# Patient Record
Sex: Female | Born: 1960 | Hispanic: No | Marital: Single | Smoking: Never smoker
Health system: Southern US, Community
[De-identification: ages and names within clinical notes are randomized; demographics above are authoritative.]

## PROBLEM LIST (undated history)

## (undated) DIAGNOSIS — I10 Essential (primary) hypertension: Secondary | ICD-10-CM

## (undated) DIAGNOSIS — Z789 Other specified health status: Secondary | ICD-10-CM

## (undated) HISTORY — DX: Essential (primary) hypertension: I10

## (undated) HISTORY — PX: NO PAST SURGERIES: SHX2092

---

## 2002-07-03 ENCOUNTER — Other Ambulatory Visit: Admission: RE | Admit: 2002-07-03 | Discharge: 2002-07-03 | Payer: Self-pay | Admitting: Family Medicine

## 2010-01-24 ENCOUNTER — Ambulatory Visit (HOSPITAL_COMMUNITY): Admission: RE | Admit: 2010-01-24 | Discharge: 2010-01-24 | Payer: Self-pay | Admitting: Family Medicine

## 2011-04-07 ENCOUNTER — Other Ambulatory Visit (HOSPITAL_COMMUNITY): Payer: Self-pay | Admitting: Family Medicine

## 2011-04-07 DIAGNOSIS — Z1231 Encounter for screening mammogram for malignant neoplasm of breast: Secondary | ICD-10-CM

## 2011-04-20 ENCOUNTER — Ambulatory Visit (HOSPITAL_COMMUNITY)
Admission: RE | Admit: 2011-04-20 | Discharge: 2011-04-20 | Disposition: A | Discharge status: Home / Self Care | Payer: PRIVATE HEALTH INSURANCE | Source: Ambulatory Visit | Attending: Family Medicine | Admitting: Family Medicine

## 2011-04-20 DIAGNOSIS — Z1231 Encounter for screening mammogram for malignant neoplasm of breast: Secondary | ICD-10-CM | POA: Insufficient documentation

## 2012-04-22 ENCOUNTER — Other Ambulatory Visit (HOSPITAL_COMMUNITY): Payer: Self-pay | Admitting: Family Medicine

## 2012-04-22 DIAGNOSIS — Z1231 Encounter for screening mammogram for malignant neoplasm of breast: Secondary | ICD-10-CM

## 2012-05-14 ENCOUNTER — Ambulatory Visit (HOSPITAL_COMMUNITY)
Admission: RE | Admit: 2012-05-14 | Discharge: 2012-05-14 | Disposition: A | Discharge status: Home / Self Care | Payer: PRIVATE HEALTH INSURANCE | Source: Ambulatory Visit | Attending: Family Medicine | Admitting: Family Medicine

## 2012-05-14 DIAGNOSIS — Z1231 Encounter for screening mammogram for malignant neoplasm of breast: Secondary | ICD-10-CM | POA: Insufficient documentation

## 2013-04-16 ENCOUNTER — Other Ambulatory Visit (HOSPITAL_COMMUNITY): Payer: Self-pay | Admitting: Family Medicine

## 2013-04-16 DIAGNOSIS — Z1231 Encounter for screening mammogram for malignant neoplasm of breast: Secondary | ICD-10-CM

## 2013-04-22 ENCOUNTER — Ambulatory Visit (HOSPITAL_COMMUNITY): Admission: RE | Admit: 2013-04-22 | Payer: PRIVATE HEALTH INSURANCE | Source: Ambulatory Visit

## 2013-05-15 ENCOUNTER — Ambulatory Visit (HOSPITAL_COMMUNITY)
Admission: RE | Admit: 2013-05-15 | Discharge: 2013-05-15 | Disposition: A | Discharge status: Home / Self Care | Payer: 59 | Source: Ambulatory Visit | Attending: Family Medicine | Admitting: Family Medicine

## 2013-05-15 DIAGNOSIS — Z1231 Encounter for screening mammogram for malignant neoplasm of breast: Secondary | ICD-10-CM

## 2013-07-08 IMAGING — CR DG FINGER MIDDLE 2+V*R*
1 series · 1 of 1 positions shown · non-contrast
Comparison: None.

CLINICAL DATA: Third finger injury at work.Trauma.

RIGHT MIDDLE FINGER 2+V

[PA]
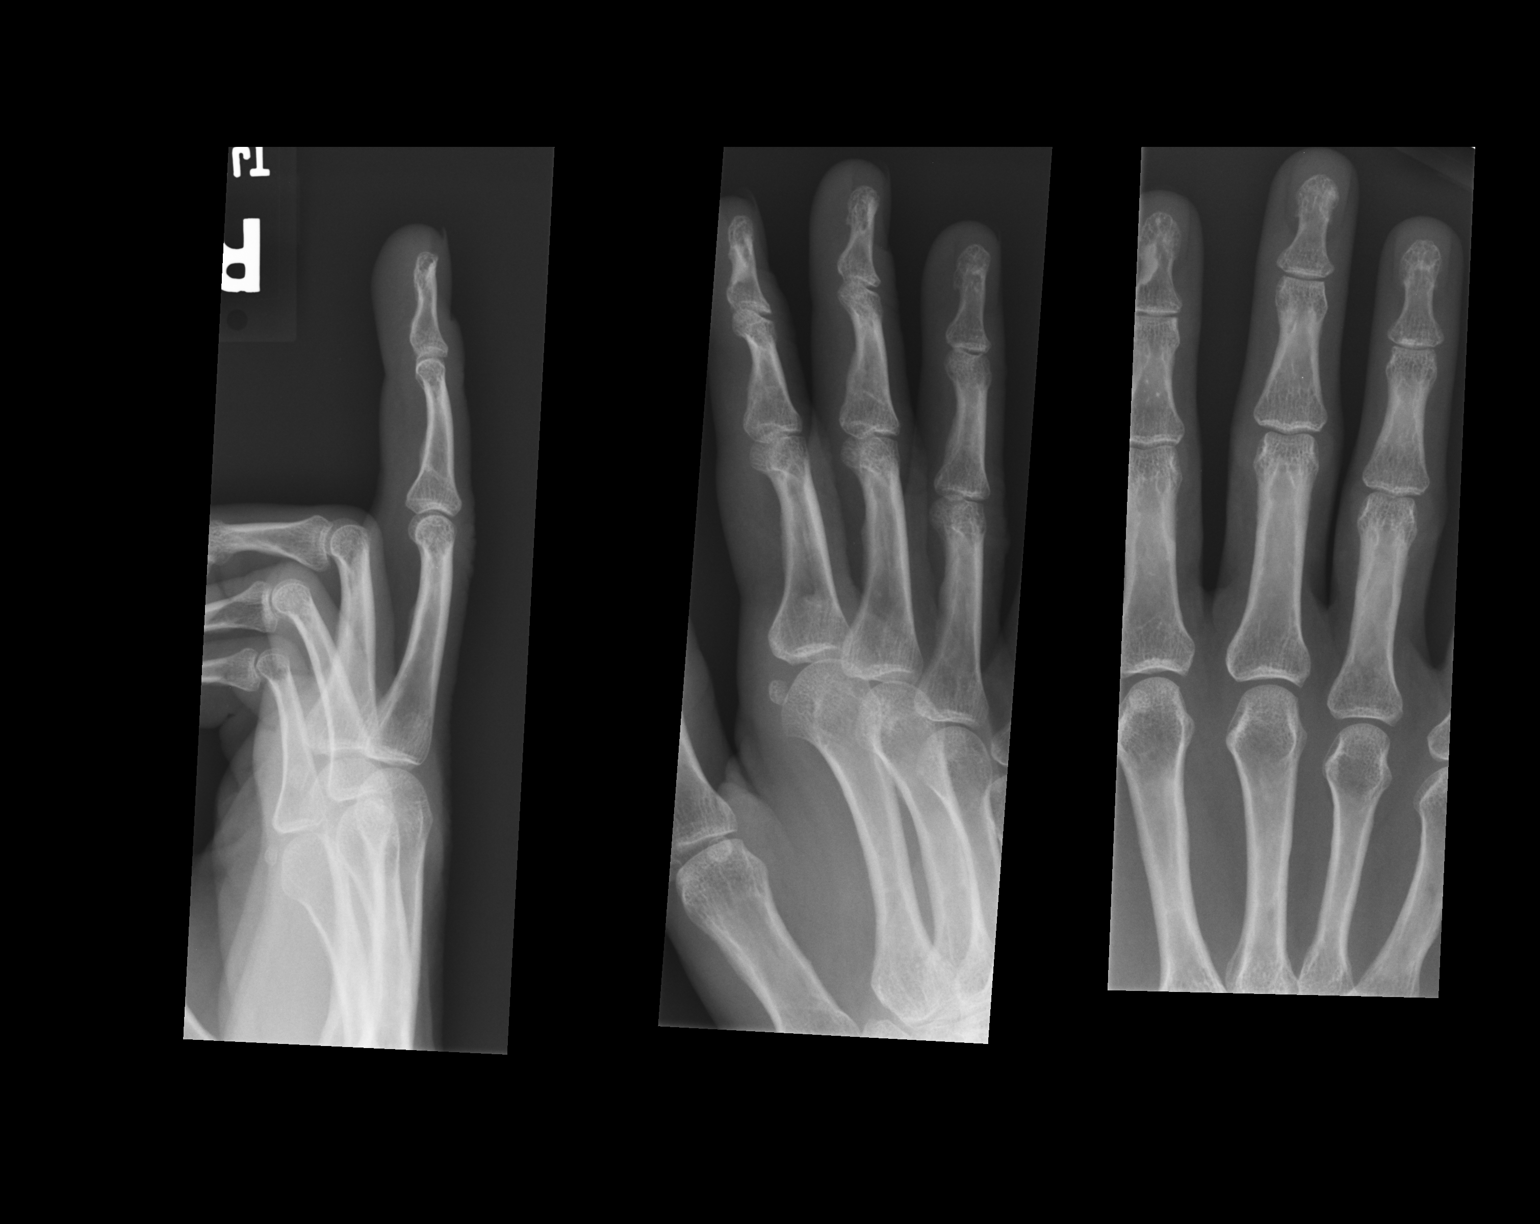

[1 of 1 positions shown; findings below may reference images not displayed]

FINDINGS: There is soft tissue swelling of the terminal phalanx of
the right long finger.  There is no fracture identified.  No
radiopaque foreign body.
IMPRESSION: Soft tissue swelling over the terminal phalanx of the right long
finger.

## 2013-10-28 ENCOUNTER — Ambulatory Visit (INDEPENDENT_AMBULATORY_CARE_PROVIDER_SITE_OTHER): Payer: 59 | Admitting: Emergency Medicine

## 2013-10-28 VITALS — BP 140/90 | HR 73 | Temp 98.1°F | Resp 16 | Ht 65.25 in | Wt 159.8 lb

## 2013-10-28 DIAGNOSIS — M549 Dorsalgia, unspecified: Secondary | ICD-10-CM

## 2013-10-28 NOTE — Patient Instructions (Signed)
Back Pain, Adult Low back pain is very common. About 1 in 5 people have back pain.The cause of low back pain is rarely dangerous. The pain often gets better over time.About half of people with a sudden onset of back pain feel better in just 2 weeks. About 8 in 10 people feel better by 6 weeks.  CAUSES Some common causes of back pain include:  Strain of the muscles or ligaments supporting the spine.  Wear and tear (degeneration) of the spinal discs.  Arthritis.  Direct injury to the back. DIAGNOSIS Most of the time, the direct cause of low back pain is not known.However, back pain can be treated effectively even when the exact cause of the pain is unknown.Answering your caregiver's questions about your overall health and symptoms is one of the most accurate ways to make sure the cause of your pain is not dangerous. If your caregiver needs more information, he or she may order lab work or imaging tests (X-rays or MRIs).However, even if imaging tests show changes in your back, this usually does not require surgery. HOME CARE INSTRUCTIONS For many people, back pain returns.Since low back pain is rarely dangerous, it is often a condition that people can learn to manageon their own.   Remain active. It is stressful on the back to sit or stand in one place. Do not sit, drive, or stand in one place for more than 30 minutes at a time. Take short walks on level surfaces as soon as pain allows.Try to increase the length of time you walk each day.  Do not stay in bed.Resting more than 1 or 2 days can delay your recovery.  Do not avoid exercise or work.Your body is made to move.It is not dangerous to be active, even though your back may hurt.Your back will likely heal faster if you return to being active before your pain is gone.  Pay attention to your body when you bend and lift. Many people have less discomfortwhen lifting if they bend their knees, keep the load close to their bodies,and  avoid twisting. Often, the most comfortable positions are those that put less stress on your recovering back.  Find a comfortable position to sleep. Use a firm mattress and lie on your side with your knees slightly bent. If you lie on your back, put a pillow under your knees.  Only take over-the-counter or prescription medicines as directed by your caregiver. Over-the-counter medicines to reduce pain and inflammation are often the most helpful.Your caregiver may prescribe muscle relaxant drugs.These medicines help dull your pain so you can more quickly return to your normal activities and healthy exercise.  Put ice on the injured area.  Put ice in a plastic bag.  Place a towel between your skin and the bag.  Leave the ice on for 15-20 minutes, 03-04 times a day for the first 2 to 3 days. After that, ice and heat may be alternated to reduce pain and spasms.  Ask your caregiver about trying back exercises and gentle massage. This may be of some benefit.  Avoid feeling anxious or stressed.Stress increases muscle tension and can worsen back pain.It is important to recognize when you are anxious or stressed and learn ways to manage it.Exercise is a great option. SEEK MEDICAL CARE IF:  You have pain that is not relieved with rest or medicine.  You have pain that does not improve in 1 week.  You have new symptoms.  You are generally not feeling well. SEEK   IMMEDIATE MEDICAL CARE IF:   You have pain that radiates from your back into your legs.  You develop new bowel or bladder control problems.  You have unusual weakness or numbness in your arms or legs.  You develop nausea or vomiting.  You develop abdominal pain.  You feel faint. Document Released: 10/23/2005 Document Revised: 04/23/2012 Document Reviewed: 03/13/2011 Exit[employer] Patient Information 2014 [employer].  

## 2013-10-28 NOTE — Progress Notes (Signed)
[facility] [address], [place] [address] [phone]  Date:  10/28/2013   Name:  Madison Turner   DOB:  Feb 19, 1961   MRN:  [id number]  PCP:  No primary provider on file.    Chief Complaint: Doctor Note   History of Present Illness:  Chelby Salata is a 52 y.o. very pleasant female patient who presents with the following:  Claims an injury to her low back 6 years ago.  Was transferred to another unit where the beds are crank rather than electric.  She has been on the new position for one week and is forced to sit on the floor to crank the beds.  Otherwise she develops low back pain.  No improvement with over the counter medications or other home remedies. Denies other complaint or health concern today.   There are no active problems to display for this patient.   History reviewed. No pertinent past medical history.  History reviewed. No pertinent past surgical history.  History  Substance Use Topics  . Smoking status: Never Smoker   . Smokeless tobacco: Not on file  . Alcohol Use: No    History reviewed. No pertinent family history.  No Known Allergies  Medication list has been reviewed and updated.  No current outpatient prescriptions on file prior to visit.   No current facility-administered medications on file prior to visit.    Review of Systems:  As per HPI, otherwise negative.    Physical Examination: Filed Vitals:   10/28/13 1331  BP: 140/90  Pulse: 73  Temp: 98.1 F (36.7 C)  Resp: 16   Filed Vitals:   10/28/13 1331  Height: 5' 5.25" (1.657 m)  Weight: 159 lb 12.8 oz (72.485 kg)   Body mass index is 26.4 kg/(m^2). Ideal Body Weight: Weight in (lb) to have BMI = 25: 151.1   GEN: WDWN, NAD, Non-toxic, Alert & Oriented x 3 HEENT: Atraumatic, Normocephalic.  Ears and Nose: No external deformity. EXTR: No clubbing/cyanosis/edema NEURO: Normal gait.  PSYCH: Normally interactive. Conversant. Not depressed or anxious  appearing.  Calm demeanor.  Back  Not tender.  Assessment and Plan: Low back pain Work note  Signed,  Phillips Odor, MD

## 2013-10-29 ENCOUNTER — Telehealth: Payer: Self-pay

## 2013-10-29 NOTE — Telephone Encounter (Signed)
Patient needs a new note for work. Information is in Dr. Ewell Poe box.

## 2013-10-31 NOTE — Telephone Encounter (Signed)
What is she requesting? 

## 2013-11-03 NOTE — Telephone Encounter (Signed)
What was it you put in the box? It is not there now.

## 2013-11-03 NOTE — Telephone Encounter (Signed)
I don't know what information she needs. I put everything in Dr. Ewell Poe box.

## 2013-11-03 NOTE — Telephone Encounter (Signed)
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

## 2013-11-03 NOTE — Telephone Encounter (Signed)
There is nothing in the box, what does she need? Do you have what she dropped off?

## 2013-11-04 ENCOUNTER — Telehealth: Payer: Self-pay | Admitting: Radiology

## 2013-11-04 NOTE — Telephone Encounter (Signed)
New note provided based on job description provided by patient

## 2014-04-27 ENCOUNTER — Other Ambulatory Visit (HOSPITAL_COMMUNITY): Payer: Self-pay | Admitting: Family Medicine

## 2014-04-27 DIAGNOSIS — Z1231 Encounter for screening mammogram for malignant neoplasm of breast: Secondary | ICD-10-CM

## 2014-05-19 ENCOUNTER — Ambulatory Visit (HOSPITAL_COMMUNITY)
Admission: RE | Admit: 2014-05-19 | Discharge: 2014-05-19 | Disposition: A | Discharge status: Home / Self Care | Payer: 59 | Source: Ambulatory Visit | Attending: Family Medicine | Admitting: Family Medicine

## 2014-05-19 DIAGNOSIS — Z1231 Encounter for screening mammogram for malignant neoplasm of breast: Secondary | ICD-10-CM

## 2015-01-05 ENCOUNTER — Other Ambulatory Visit: Payer: Self-pay | Admitting: Orthopedic Surgery

## 2015-01-11 ENCOUNTER — Encounter (HOSPITAL_BASED_OUTPATIENT_CLINIC_OR_DEPARTMENT_OTHER): Payer: Self-pay | Admitting: *Deleted

## 2015-01-11 NOTE — Progress Notes (Signed)
No labs needed

## 2015-01-13 ENCOUNTER — Ambulatory Visit (HOSPITAL_BASED_OUTPATIENT_CLINIC_OR_DEPARTMENT_OTHER): Payer: Managed Care, Other (non HMO) | Admitting: Anesthesiology

## 2015-01-13 ENCOUNTER — Ambulatory Visit (HOSPITAL_BASED_OUTPATIENT_CLINIC_OR_DEPARTMENT_OTHER)
Admission: RE | Admit: 2015-01-13 | Discharge: 2015-01-13 | Disposition: A | Discharge status: Home / Self Care | Payer: Managed Care, Other (non HMO) | Source: Ambulatory Visit | Attending: Orthopedic Surgery | Admitting: Orthopedic Surgery

## 2015-01-13 ENCOUNTER — Encounter (HOSPITAL_BASED_OUTPATIENT_CLINIC_OR_DEPARTMENT_OTHER)
Admission: RE | Disposition: A | Discharge status: Home / Self Care | Payer: Self-pay | Source: Ambulatory Visit | Attending: Orthopedic Surgery

## 2015-01-13 ENCOUNTER — Encounter (HOSPITAL_BASED_OUTPATIENT_CLINIC_OR_DEPARTMENT_OTHER): Payer: Self-pay | Admitting: Anesthesiology

## 2015-01-13 DIAGNOSIS — M65311 Trigger thumb, right thumb: Secondary | ICD-10-CM | POA: Diagnosis present

## 2015-01-13 HISTORY — DX: Other specified health status: Z78.9

## 2015-01-13 HISTORY — PX: TRIGGER FINGER RELEASE: SHX641

## 2015-01-13 LAB — POCT HEMOGLOBIN-HEMACUE: Hemoglobin: 11.9 g/dL — ABNORMAL LOW (ref 12.0–15.0)

## 2015-01-13 SURGERY — RELEASE, A1 PULLEY, FOR TRIGGER FINGER
Anesthesia: Monitor Anesthesia Care | Site: Hand | Laterality: Right

## 2015-01-13 MED ORDER — FENTANYL CITRATE 0.05 MG/ML IJ SOLN
INTRAMUSCULAR | Status: AC
  Filled 2015-01-13: qty 6

## 2015-01-13 MED ORDER — ONDANSETRON HCL 4 MG/2ML IJ SOLN: 4.0000 mg | Freq: Once | INTRAMUSCULAR | Status: DC | PRN

## 2015-01-13 MED ORDER — MIDAZOLAM HCL 2 MG/2ML IJ SOLN
INTRAMUSCULAR | Status: AC
  Filled 2015-01-13: qty 2

## 2015-01-13 MED ORDER — ONDANSETRON HCL 4 MG/2ML IJ SOLN
INTRAMUSCULAR | Status: DC | PRN
  Administered 2015-01-13: 4 mg via INTRAVENOUS

## 2015-01-13 MED ORDER — OXYCODONE HCL 5 MG PO TABS: 5.0000 mg | ORAL_TABLET | Freq: Once | ORAL | Status: DC | PRN

## 2015-01-13 MED ORDER — LIDOCAINE HCL (CARDIAC) 20 MG/ML IV SOLN
INTRAVENOUS | Status: DC | PRN
  Administered 2015-01-13: 35 mg via INTRAVENOUS

## 2015-01-13 MED ORDER — HYDROMORPHONE HCL 1 MG/ML IJ SOLN: 0.2500 mg | INTRAMUSCULAR | Status: DC | PRN

## 2015-01-13 MED ORDER — FENTANYL CITRATE 0.05 MG/ML IJ SOLN: 50.0000 ug | INTRAMUSCULAR | Status: DC | PRN

## 2015-01-13 MED ORDER — HYDROCODONE-ACETAMINOPHEN 5-325 MG PO TABS: 1.0000 | ORAL_TABLET | Freq: Four times a day (QID) | ORAL | Status: DC | PRN

## 2015-01-13 MED ORDER — PROPOFOL 10 MG/ML IV BOLUS
INTRAVENOUS | Status: AC
  Filled 2015-01-13: qty 80

## 2015-01-13 MED ORDER — LACTATED RINGERS IV SOLN
INTRAVENOUS | Status: DC
  Administered 2015-01-13: 11:00:00 via INTRAVENOUS

## 2015-01-13 MED ORDER — PROPOFOL INFUSION 10 MG/ML OPTIME
INTRAVENOUS | Status: DC | PRN
Start: 2015-01-13 — End: 2015-01-13
  Administered 2015-01-13: 25 ug/kg/min via INTRAVENOUS

## 2015-01-13 MED ORDER — PROPOFOL 10 MG/ML IV BOLUS: INTRAVENOUS | Status: DC | PRN

## 2015-01-13 MED ORDER — BUPIVACAINE HCL (PF) 0.5 % IJ SOLN
INTRAMUSCULAR | Status: DC | PRN
  Administered 2015-01-13: 7 mL

## 2015-01-13 MED ORDER — FENTANYL CITRATE 0.05 MG/ML IJ SOLN
INTRAMUSCULAR | Status: DC | PRN
  Administered 2015-01-13 (×2): 50 ug via INTRAVENOUS

## 2015-01-13 MED ORDER — CEFAZOLIN SODIUM 1-5 GM-% IV SOLN
INTRAVENOUS | Status: AC
  Filled 2015-01-13: qty 50

## 2015-01-13 MED ORDER — MIDAZOLAM HCL 2 MG/2ML IJ SOLN
1.0000 mg | INTRAMUSCULAR | Status: DC | PRN
Start: 2015-01-13 — End: 2015-01-13

## 2015-01-13 MED ORDER — CHLORHEXIDINE GLUCONATE 4 % EX LIQD: 60.0000 mL | Freq: Once | CUTANEOUS | Status: DC

## 2015-01-13 MED ORDER — PROPOFOL 10 MG/ML IV EMUL
INTRAVENOUS | Status: AC
  Filled 2015-01-13: qty 50

## 2015-01-13 MED ORDER — MIDAZOLAM HCL 5 MG/5ML IJ SOLN
INTRAMUSCULAR | Status: DC | PRN
  Administered 2015-01-13: 2 mg via INTRAVENOUS

## 2015-01-13 MED ORDER — OXYCODONE HCL 5 MG/5ML PO SOLN: 5.0000 mg | Freq: Once | ORAL | Status: DC | PRN

## 2015-01-13 MED ORDER — CEFAZOLIN SODIUM-DEXTROSE 2-3 GM-% IV SOLR: 2.0000 g | INTRAVENOUS | Status: DC

## 2015-01-13 MED ORDER — CEFAZOLIN SODIUM-DEXTROSE 2-3 GM-% IV SOLR
INTRAVENOUS | Status: DC | PRN
  Administered 2015-01-13: 2 g via INTRAVENOUS

## 2015-01-13 SURGICAL SUPPLY — 48 items
BANDAGE ELASTIC 3 VELCRO ST LF (GAUZE/BANDAGES/DRESSINGS) ×2 IMPLANT
BLADE MINI RND TIP GREEN BEAV (BLADE) IMPLANT
BLADE SURG 15 STRL LF DISP TIS (BLADE) ×1 IMPLANT
BLADE SURG 15 STRL SS (BLADE) ×3
BNDG CMPR 9X4 STRL LF SNTH (GAUZE/BANDAGES/DRESSINGS)
BNDG CMPR MD 5X2 ELC HKLP STRL (GAUZE/BANDAGES/DRESSINGS) ×1
BNDG ELASTIC 2 VLCR STRL LF (GAUZE/BANDAGES/DRESSINGS) ×3 IMPLANT
BNDG ESMARK 4X9 LF (GAUZE/BANDAGES/DRESSINGS) IMPLANT
CORDS BIPOLAR (ELECTRODE) IMPLANT
COVER BACK TABLE 60X90IN (DRAPES) ×3 IMPLANT
COVER MAYO STAND STRL (DRAPES) ×3 IMPLANT
CUFF TOURNIQUET SINGLE 18IN (TOURNIQUET CUFF) IMPLANT
DECANTER SPIKE VIAL GLASS SM (MISCELLANEOUS) IMPLANT
DRAPE EXTREMITY T 121X128X90 (DRAPE) ×3 IMPLANT
DRSG EMULSION OIL 3X3 NADH (GAUZE/BANDAGES/DRESSINGS) IMPLANT
DURAPREP 26ML APPLICATOR (WOUND CARE) ×3 IMPLANT
ELECT NDL TIP 2.8 STRL (NEEDLE) IMPLANT
ELECT NEEDLE TIP 2.8 STRL (NEEDLE) IMPLANT
ELECT REM PT RETURN 9FT ADLT (ELECTROSURGICAL) IMPLANT
ELECTRODE REM PT RTRN 9FT ADLT (ELECTROSURGICAL) IMPLANT
GAUZE SPONGE 4X4 12PLY STRL (GAUZE/BANDAGES/DRESSINGS) ×3 IMPLANT
GLOVE BIOGEL PI IND STRL 8 (GLOVE) ×2 IMPLANT
GLOVE BIOGEL PI INDICATOR 8 (GLOVE) ×4
GLOVE ECLIPSE 7.5 STRL STRAW (GLOVE) ×6 IMPLANT
GOWN STRL REUS W/ TWL LRG LVL3 (GOWN DISPOSABLE) ×1 IMPLANT
GOWN STRL REUS W/ TWL XL LVL3 (GOWN DISPOSABLE) ×1 IMPLANT
GOWN STRL REUS W/TWL LRG LVL3 (GOWN DISPOSABLE) ×3
GOWN STRL REUS W/TWL XL LVL3 (GOWN DISPOSABLE) ×6 IMPLANT
NDL HYPO 25X1 1.5 SAFETY (NEEDLE) IMPLANT
NEEDLE HYPO 25X1 1.5 SAFETY (NEEDLE) ×3 IMPLANT
NS IRRIG 1000ML POUR BTL (IV SOLUTION) ×3 IMPLANT
PACK BASIN DAY SURGERY FS (CUSTOM PROCEDURE TRAY) ×3 IMPLANT
PADDING CAST ABS 3INX4YD NS (CAST SUPPLIES) ×2
PADDING CAST ABS 4INX4YD NS (CAST SUPPLIES) ×2
PADDING CAST ABS COTTON 3X4 (CAST SUPPLIES) IMPLANT
PADDING CAST ABS COTTON 4X4 ST (CAST SUPPLIES) ×1 IMPLANT
PADDING UNDERCAST 2 STRL (CAST SUPPLIES) ×2
PADDING UNDERCAST 2X4 STRL (CAST SUPPLIES) ×1 IMPLANT
PENCIL BUTTON HOLSTER BLD 10FT (ELECTRODE) IMPLANT
SPLINT PLASTER CAST XFAST 3X15 (CAST SUPPLIES) IMPLANT
SPLINT PLASTER XTRA FASTSET 3X (CAST SUPPLIES)
STOCKINETTE 4X48 STRL (DRAPES) ×3 IMPLANT
SUT ETHILON 4 0 P 3 18 (SUTURE) ×3 IMPLANT
SYR BULB 3OZ (MISCELLANEOUS) ×3 IMPLANT
SYR CONTROL 10ML LL (SYRINGE) IMPLANT
TOWEL OR 17X24 6PK STRL BLUE (TOWEL DISPOSABLE) ×6 IMPLANT
TOWEL OR NON WOVEN STRL DISP B (DISPOSABLE) ×3 IMPLANT
UNDERPAD 30X30 INCONTINENT (UNDERPADS AND DIAPERS) ×3 IMPLANT

## 2015-01-13 NOTE — H&P (Signed)
PREOPERATIVE H&P  Chief Complaint: locking r thumb  HPI: Madison Turner is a 54 y.o. female who presents for evaluation of locking r thumb. It has been present for 3 months and has been worsening. She has failed conservative measures. Pain is rated as moderate.  Past Medical History  Diagnosis Date  . Medical history non-contributory    Past Surgical History  Procedure Laterality Date  . No past surgeries     History   Social History  . Marital Status: Single    Spouse Name: N/A  . Number of Children: N/A  . Years of Education: N/A   Social History Main Topics  . Smoking status: Never Smoker   . Smokeless tobacco: Not on file  . Alcohol Use: No  . Drug Use: No  . Sexual Activity: Not on file   Other Topics Concern  . None   Social History Narrative   History reviewed. No pertinent family history. No Known Allergies Prior to Admission medications   Not on File     Positive ROS: none  All other systems have been reviewed and were otherwise negative with the exception of those mentioned in the HPI and as above.  Physical Exam: Filed Vitals:   01/13/15 1025  BP: 134/80  Pulse: 79  Temp: 98.4 F (36.9 C)  Resp: 20    General: Alert, no acute distress Cardiovascular: No pedal edema Respiratory: No cyanosis, no use of accessory musculature GI: No organomegaly, abdomen is soft and non-tender Skin: No lesions in the area of chief complaint Neurologic: Sensation intact distally Psychiatric: Patient is competent for consent with normal mood and affect Lymphatic: No axillary or cervical lymphadenopathy  MUSCULOSKELETAL: r thumb: triggering at a-1 pulley  Assessment/Plan: right trigger thumb Plan for Procedure(s): RELEASE TRIGGER FINGER/A-1 PULLEY  The risks benefits and alternatives were discussed with the patient including but not limited to the risks of nonoperative treatment, versus surgical intervention including infection, bleeding, nerve injury,  malunion, nonunion, hardware prominence, hardware failure, need for hardware removal, blood clots, cardiopulmonary complications, morbidity, mortality, among others, and they were willing to proceed.  Predicted outcome is good, although there will be at least a six to nine month expected recovery.  Aryannah Mohon L, MD 01/13/2015 10:34 AM

## 2015-01-13 NOTE — Discharge Instructions (Signed)
Elevate your right hand as much as possible. Move your thumb as tolerated. In 3 days remove the dressing and applying a Band-Aid over the small incision. Re-wrap the Ace bandage around your thumb and wrist. Keep your incision dry until your follow-up appointment. If needed you may use a waterproof Band-Aid over the incision when you shower.   Post Anesthesia Home Care Instructions  Activity: Get plenty of rest for the remainder of the day. A responsible adult should stay with you for 24 hours following the procedure.  For the next 24 hours, DO NOT: -Drive a car -Advertising copywriterperate machinery -Drink alcoholic beverages -Take any medication unless instructed by your physician -Make any legal decisions or sign important papers.  Meals: Start with liquid foods such as gelatin or soup. Progress to regular foods as tolerated. Avoid greasy, spicy, heavy foods. If nausea and/or vomiting occur, drink only clear liquids until the nausea and/or vomiting subsides. Call your physician if vomiting continues.  Special Instructions/Symptoms: Your throat may feel dry or sore from the anesthesia or the breathing tube placed in your throat during surgery. If this causes discomfort, gargle with warm salt water. The discomfort should disappear within 24 hours.

## 2015-01-13 NOTE — Transfer of Care (Signed)
Immediate Anesthesia Transfer of Care Note  Patient: Madison Turner  Procedure(s) Performed: Procedure(s): RELEASE TRIGGER FINGER/A-1 PULLEY (Right)  Patient Location: PACU  Anesthesia Type:MAC and Bier block  Level of Consciousness: awake and patient cooperative  Airway & Oxygen Therapy: Patient Spontanous Breathing and Patient connected to face mask oxygen  Post-op Assessment: Report given to RN and Post -op Vital signs reviewed and stable  Post vital signs: Reviewed and stable  Last Vitals:  Filed Vitals:   01/13/15 1136  BP:   Pulse: 62  Temp:   Resp:     Complications: No apparent anesthesia complications

## 2015-01-13 NOTE — Anesthesia Procedure Notes (Signed)
Anesthesia Regional Block:  Bier block (IV Regional)  Pre-Anesthetic Checklist: ,, timeout performed, Correct Patient, Correct Site, Correct Laterality, Correct Procedure, Correct Position, site marked, Risks and benefits discussed,  Surgical consent,  Pre-op evaluation,  At surgeon's request and post-op pain management  Laterality: Right     Bier block (IV Regional) Narrative:   Additional Notes: Right hand Bier block placed at 1102 without complication.  0.5% Lidocaine 35 ml infused slowly without difficulty or patient complaint of any new or different sensations.  Craige CottaM Kenyata Guess CRNA

## 2015-01-13 NOTE — Anesthesia Preprocedure Evaluation (Signed)
Anesthesia Evaluation  Patient identified by MRN, date of birth, ID band Patient awake    Reviewed: Allergy & Precautions, NPO status , Patient's Chart, lab work & pertinent test results  Airway Mallampati: I  TM Distance: >3 FB Neck ROM: Full    Dental  (+) Teeth Intact, Dental Advisory Given   Pulmonary  breath sounds clear to auscultation        Cardiovascular Rhythm:Regular Rate:Normal     Neuro/Psych    GI/Hepatic   Endo/Other    Renal/GU      Musculoskeletal   Abdominal   Peds  Hematology   Anesthesia Other Findings   Reproductive/Obstetrics                             Anesthesia Physical Anesthesia Plan  ASA: I  Anesthesia Plan: MAC and Bier Block   Post-op Pain Management:    Induction: Intravenous  Airway Management Planned:   Additional Equipment:   Intra-op Plan:   Post-operative Plan: Extubation in OR  Informed Consent: I have reviewed the patients History and Physical, chart, labs and discussed the procedure including the risks, benefits and alternatives for the proposed anesthesia with the patient or authorized representative who has indicated his/her understanding and acceptance.   Dental advisory given  Plan Discussed with: CRNA, Anesthesiologist and Surgeon  Anesthesia Plan Comments:         Anesthesia Quick Evaluation

## 2015-01-13 NOTE — Anesthesia Postprocedure Evaluation (Signed)
  Anesthesia Post-op Note  Patient: Madison Turner  Procedure(s) Performed: Procedure(s): RELEASE TRIGGER FINGER/A-1 PULLEY (Right)  Patient Location: PACU  Anesthesia Type: MAC, Bier Block   Level of Consciousness: awake, alert  and oriented  Airway and Oxygen Therapy: Patient Spontanous Breathing  Post-op Pain: none  Post-op Assessment: Post-op Vital signs reviewed  Post-op Vital Signs: Reviewed  Last Vitals:  Filed Vitals:   01/13/15 1215  BP: 138/75  Pulse: 60  Temp: 36.4 C  Resp: 16    Complications: No apparent anesthesia complications

## 2015-01-13 NOTE — Brief Op Note (Signed)
01/13/2015  11:46 AM  PATIENT:  Madison Turner  54 y.o. female  PRE-OPERATIVE DIAGNOSIS:  right trigger thumb  POST-OPERATIVE DIAGNOSIS:  right trigger thumb  PROCEDURE:  Procedure(s): RELEASE TRIGGER FINGER/A-1 PULLEY (Right)  SURGEON:  Surgeon(s) and Role:    * Jodi GeraldsJohn Draedyn Weidinger, MD - Primary  PHYSICIAN ASSISTANT:   ASSISTANTS: bethune   ANESTHESIA:   general  EBL:  Total I/O In: 400 [I.V.:400] Out: -   BLOOD ADMINISTERED:none  DRAINS: none   LOCAL MEDICATIONS USED:  MARCAINE     SPECIMEN:  No Specimen  DISPOSITION OF SPECIMEN:  N/A  COUNTS:  YES  TOURNIQUET:   Total Tourniquet Time Documented: Forearm (Right) - 29 minutes Total: Forearm (Right) - 29 minutes   DICTATION: .Other Dictation: Dictation Number [id number]  PLAN OF CARE: Discharge to home after PACU  PATIENT DISPOSITION:  PACU - hemodynamically stable.   Delay start of Pharmacological VTE agent (>24hrs) due to surgical blood loss or risk of bleeding: no

## 2015-01-14 ENCOUNTER — Encounter (HOSPITAL_BASED_OUTPATIENT_CLINIC_OR_DEPARTMENT_OTHER): Payer: Self-pay | Admitting: Orthopedic Surgery

## 2015-01-14 NOTE — Op Note (Signed)
[place]:  Lyness, Gregg                 ACCOUNT NO.:  [id number]  MEDICAL RECORD NO.:  [id number]  LOCATION:                                 FACILITY:  PHYSICIAN:  Harvie JuniorJohn L. Montague Corella, M.D.   DATE OF BIRTH:  [birth year]  DATE OF PROCEDURE:  01/13/2015 DATE OF DISCHARGE:  01/13/2015                              OPERATIVE REPORT   PREOPERATIVE DIAGNOSIS:  Trigger finger, right.  POSTOPERATIVE DIAGNOSIS:  Trigger finger, right.  PROCEDURE:  Right trigger thumb release.  SURGEON:  Harvie JuniorJohn L. Caeley Dohrmann, M.D.  ASSISTANT:  Marshia LyJames Bethune, P.A.  ANESTHESIA:  General.  BRIEF HISTORY:  Ms. Nonnie DoneKareem is a 54 year old female with a history of significant triggering of her right thumb.  We treated conservatively with injection therapy and activity modification.  After failure of all conservative care, she was taken to the operating room for right trigger thumb release.  DESCRIPTION OF PROCEDURE:  The patient was taken to the operating room. After adequate anesthesia was obtained with a forearm base IV regional placed, the patient was placed supine on the operating table.  Right thumb was then prepped and draped in usual sterile fashion.  Following this, an incision was made over the A1 pulley subcutaneous tissue down the level of the A1 pulley.  The A1 pulley was identified, incised, and then opened.  The tendon was then pulled into the wound to make sure there was no additional triggering.  At this point, the wound was copiously and thoroughly irrigated, suctioned dry, closed in layers. Sterile compressive dressing was applied.  The patient was taken to the recovery room and she was noted to be in satisfactory condition with estimated blood loss for the procedure being none.     Harvie JuniorJohn L. Kinzey Sheriff, M.D.     Ranae PlumberJLG/MEDQ  D:  01/13/2015  T:  01/14/2015  Job:  [id number]

## 2015-06-15 ENCOUNTER — Other Ambulatory Visit: Payer: Self-pay

## 2015-06-15 DIAGNOSIS — Z1231 Encounter for screening mammogram for malignant neoplasm of breast: Secondary | ICD-10-CM

## 2015-06-23 ENCOUNTER — Ambulatory Visit
Admission: RE | Admit: 2015-06-23 | Discharge: 2015-06-23 | Disposition: A | Discharge status: Home / Self Care | Payer: Managed Care, Other (non HMO) | Source: Ambulatory Visit

## 2015-06-23 DIAGNOSIS — Z1231 Encounter for screening mammogram for malignant neoplasm of breast: Secondary | ICD-10-CM

## 2015-06-25 ENCOUNTER — Other Ambulatory Visit: Payer: Self-pay | Admitting: Family Medicine

## 2015-06-25 DIAGNOSIS — R928 Other abnormal and inconclusive findings on diagnostic imaging of breast: Secondary | ICD-10-CM

## 2015-07-06 ENCOUNTER — Other Ambulatory Visit: Payer: Managed Care, Other (non HMO)

## 2015-07-07 ENCOUNTER — Other Ambulatory Visit: Payer: Managed Care, Other (non HMO)

## 2015-07-23 ENCOUNTER — Ambulatory Visit
Admission: RE | Admit: 2015-07-23 | Discharge: 2015-07-23 | Disposition: A | Discharge status: Home / Self Care | Payer: Managed Care, Other (non HMO) | Source: Ambulatory Visit | Attending: Family Medicine | Admitting: Family Medicine

## 2015-07-23 DIAGNOSIS — R928 Other abnormal and inconclusive findings on diagnostic imaging of breast: Secondary | ICD-10-CM

## 2017-11-05 LAB — HM COLONOSCOPY

## 2018-08-27 DIAGNOSIS — Z01419 Encounter for gynecological examination (general) (routine) without abnormal findings: Secondary | ICD-10-CM | POA: Diagnosis not present

## 2018-08-27 DIAGNOSIS — Z130 Encounter for screening for diseases of the blood and blood-forming organs and certain disorders involving the immune mechanism: Secondary | ICD-10-CM | POA: Diagnosis not present

## 2018-08-27 DIAGNOSIS — Z6826 Body mass index (BMI) 26.0-26.9, adult: Secondary | ICD-10-CM | POA: Diagnosis not present

## 2018-08-27 DIAGNOSIS — Z1329 Encounter for screening for other suspected endocrine disorder: Secondary | ICD-10-CM | POA: Diagnosis not present

## 2018-08-27 DIAGNOSIS — Z131 Encounter for screening for diabetes mellitus: Secondary | ICD-10-CM | POA: Diagnosis not present

## 2018-08-27 DIAGNOSIS — Z0000 Encounter for general adult medical examination without abnormal findings: Secondary | ICD-10-CM | POA: Diagnosis not present

## 2018-08-27 DIAGNOSIS — Z13220 Encounter for screening for lipoid disorders: Secondary | ICD-10-CM | POA: Diagnosis not present

## 2018-08-27 DIAGNOSIS — Z1151 Encounter for screening for human papillomavirus (HPV): Secondary | ICD-10-CM | POA: Diagnosis not present

## 2018-08-27 DIAGNOSIS — Z1231 Encounter for screening mammogram for malignant neoplasm of breast: Secondary | ICD-10-CM | POA: Diagnosis not present

## 2019-07-12 ENCOUNTER — Emergency Department (HOSPITAL_COMMUNITY)
Admission: EM | Admit: 2019-07-12 | Discharge: 2019-07-13 | Disposition: A | Discharge status: Home / Self Care | Payer: No Typology Code available for payment source | Attending: Emergency Medicine | Admitting: Emergency Medicine

## 2019-07-12 ENCOUNTER — Other Ambulatory Visit: Payer: Self-pay

## 2019-07-12 ENCOUNTER — Encounter (HOSPITAL_COMMUNITY): Payer: Self-pay | Admitting: Emergency Medicine

## 2019-07-12 DIAGNOSIS — S20219A Contusion of unspecified front wall of thorax, initial encounter: Secondary | ICD-10-CM | POA: Insufficient documentation

## 2019-07-12 DIAGNOSIS — Y990 Civilian activity done for income or pay: Secondary | ICD-10-CM | POA: Insufficient documentation

## 2019-07-12 DIAGNOSIS — W228XXA Striking against or struck by other objects, initial encounter: Secondary | ICD-10-CM | POA: Insufficient documentation

## 2019-07-12 DIAGNOSIS — Y93F2 Activity, caregiving, lifting: Secondary | ICD-10-CM | POA: Diagnosis not present

## 2019-07-12 DIAGNOSIS — Y929 Unspecified place or not applicable: Secondary | ICD-10-CM | POA: Insufficient documentation

## 2019-07-12 DIAGNOSIS — S299XXA Unspecified injury of thorax, initial encounter: Secondary | ICD-10-CM | POA: Diagnosis present

## 2019-07-12 NOTE — ED Triage Notes (Signed)
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

## 2019-07-13 ENCOUNTER — Emergency Department (HOSPITAL_COMMUNITY): Payer: No Typology Code available for payment source

## 2019-07-13 NOTE — ED Notes (Signed)
Patient transported to X-ray 

## 2019-07-13 NOTE — ED Notes (Signed)
Pt verbalized discharge instructions and follow up

## 2019-07-13 NOTE — ED Provider Notes (Signed)
[facility] EMERGENCY DEPARTMENT Provider Note   CSN: [id number] Arrival date & time: 07/12/19  2321    History   Chief Complaint Chief Complaint  Patient presents with  . Chest Injury    HPI Madison Turner is a 58 y.o. female.   The history is provided by the patient.  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  She denies other injury.  She is unable to put a number on her pain.  Past Medical History:  Diagnosis Date  . Medical history non-contributory     There are no active problems to display for this patient.   Past Surgical History:  Procedure Laterality Date  . NO PAST SURGERIES    . TRIGGER FINGER RELEASE Right 01/13/2015   Procedure: RELEASE TRIGGER FINGER/A-1 PULLEY;  Surgeon: Dorna Leitz, MD;  Location: [facility];  Service: Orthopedics;  Laterality: Right;     OB History   No obstetric history on file.      Home Medications    Prior to Admission medications   Medication Sig Start Date End Date Taking? Authorizing Provider  HYDROcodone-acetaminophen (NORCO) 5-325 MG per tablet Take 1-2 tablets by mouth every 6 (six) hours as needed for moderate pain. 01/13/15   Gary Fleet, PA-C    Family History No family history on file.  Social History Social History   Tobacco Use  . Smoking status: Never Smoker  Substance Use Topics  . Alcohol use: No  . Drug use: No     Allergies   Patient has no known allergies.   Review of Systems Review of Systems  All other systems reviewed and are negative.    Physical Exam Updated Vital Signs BP 140/78 (BP Location: Right Arm)   Pulse 63   Temp 97.6 F (36.4 C) (Oral)   Resp 18   LMP 04/15/2011   SpO2 98%   Physical Exam Vitals signs and nursing note reviewed.    58 year old female, resting comfortably and in no acute distress. Vital signs are normal. Oxygen saturation is 98%,  which is normal. Head is normocephalic and atraumatic. PERRLA, EOMI. Oropharynx is clear. Neck is nontender and supple without adenopathy or JVD. Back is nontender and there is no CVA tenderness. Lungs are clear without rales, wheezes, or rhonchi. Chest has very slight swelling over the superior aspect of the sternum with tenderness over the same area.  There is no crepitus. Heart has regular rate and rhythm without murmur. Abdomen is soft, flat, nontender without masses or hepatosplenomegaly and peristalsis is normoactive. Extremities have no cyanosis or edema, full range of motion is present. Skin is warm and dry without rash. Neurologic: Mental status is normal, cranial nerves are intact, there are no motor or sensory deficits.  ED Treatments / Results   Radiology No results found.  Procedures Procedures  Medications Ordered in ED Medications - No data to display   Initial Impression / Assessment and Plan / ED Course  I have reviewed the triage vital signs and the nursing notes.  Pertinent labs & imaging results that were available during my care of the patient were reviewed by me and considered in my medical decision making (see chart for details).  Chest wall contusion.  Will send for chest x-ray.  Old records are reviewed, and she has no relevant past visits.  X-rays show no evidence of  injury.  She is discharged with instructions to apply ice, use over-the-counter analgesics as needed for pain.  Final Clinical Impressions(s) / ED Diagnoses   Final diagnoses:  Contusion of chest wall, unspecified laterality, initial encounter    ED Discharge Orders    None       Dione BoozeGlick, Heydy Montilla, MD 07/13/19 [phone]

## 2019-07-13 NOTE — Discharge Instructions (Addendum)
Apply ice for thirty minutes at a time, four times a day.  Take ibuprofen and/or acetaminophen as needed for pain.

## 2019-07-15 DIAGNOSIS — Z20828 Contact with and (suspected) exposure to other viral communicable diseases: Secondary | ICD-10-CM | POA: Diagnosis not present

## 2019-07-22 DIAGNOSIS — Z20828 Contact with and (suspected) exposure to other viral communicable diseases: Secondary | ICD-10-CM | POA: Diagnosis not present

## 2019-07-28 DIAGNOSIS — Z20828 Contact with and (suspected) exposure to other viral communicable diseases: Secondary | ICD-10-CM | POA: Diagnosis not present

## 2019-08-04 DIAGNOSIS — Z20828 Contact with and (suspected) exposure to other viral communicable diseases: Secondary | ICD-10-CM | POA: Diagnosis not present

## 2019-08-11 DIAGNOSIS — Z20828 Contact with and (suspected) exposure to other viral communicable diseases: Secondary | ICD-10-CM | POA: Diagnosis not present

## 2019-08-18 DIAGNOSIS — Z20828 Contact with and (suspected) exposure to other viral communicable diseases: Secondary | ICD-10-CM | POA: Diagnosis not present

## 2019-08-25 DIAGNOSIS — Z20828 Contact with and (suspected) exposure to other viral communicable diseases: Secondary | ICD-10-CM | POA: Diagnosis not present

## 2019-09-01 DIAGNOSIS — Z20828 Contact with and (suspected) exposure to other viral communicable diseases: Secondary | ICD-10-CM | POA: Diagnosis not present

## 2019-09-05 DIAGNOSIS — Z1231 Encounter for screening mammogram for malignant neoplasm of breast: Secondary | ICD-10-CM | POA: Diagnosis not present

## 2019-09-05 DIAGNOSIS — Z6823 Body mass index (BMI) 23.0-23.9, adult: Secondary | ICD-10-CM | POA: Diagnosis not present

## 2019-09-05 DIAGNOSIS — Z01419 Encounter for gynecological examination (general) (routine) without abnormal findings: Secondary | ICD-10-CM | POA: Diagnosis not present

## 2019-09-12 DIAGNOSIS — Z13220 Encounter for screening for lipoid disorders: Secondary | ICD-10-CM | POA: Diagnosis not present

## 2019-09-12 DIAGNOSIS — Z130 Encounter for screening for diseases of the blood and blood-forming organs and certain disorders involving the immune mechanism: Secondary | ICD-10-CM | POA: Diagnosis not present

## 2019-09-12 DIAGNOSIS — Z1329 Encounter for screening for other suspected endocrine disorder: Secondary | ICD-10-CM | POA: Diagnosis not present

## 2019-09-12 DIAGNOSIS — Z0000 Encounter for general adult medical examination without abnormal findings: Secondary | ICD-10-CM | POA: Diagnosis not present

## 2020-09-09 DIAGNOSIS — Z13220 Encounter for screening for lipoid disorders: Secondary | ICD-10-CM | POA: Diagnosis not present

## 2020-09-09 DIAGNOSIS — Z130 Encounter for screening for diseases of the blood and blood-forming organs and certain disorders involving the immune mechanism: Secondary | ICD-10-CM | POA: Diagnosis not present

## 2020-09-09 DIAGNOSIS — Z6826 Body mass index (BMI) 26.0-26.9, adult: Secondary | ICD-10-CM | POA: Diagnosis not present

## 2020-09-09 DIAGNOSIS — Z1231 Encounter for screening mammogram for malignant neoplasm of breast: Secondary | ICD-10-CM | POA: Diagnosis not present

## 2020-09-09 DIAGNOSIS — Z01419 Encounter for gynecological examination (general) (routine) without abnormal findings: Secondary | ICD-10-CM | POA: Diagnosis not present

## 2020-09-09 DIAGNOSIS — Z0000 Encounter for general adult medical examination without abnormal findings: Secondary | ICD-10-CM | POA: Diagnosis not present

## 2020-09-09 DIAGNOSIS — Z131 Encounter for screening for diabetes mellitus: Secondary | ICD-10-CM | POA: Diagnosis not present

## 2020-09-09 DIAGNOSIS — Z1329 Encounter for screening for other suspected endocrine disorder: Secondary | ICD-10-CM | POA: Diagnosis not present

## 2022-09-20 DIAGNOSIS — Z1231 Encounter for screening mammogram for malignant neoplasm of breast: Secondary | ICD-10-CM | POA: Diagnosis not present

## 2024-03-25 ENCOUNTER — Ambulatory Visit: Admitting: Podiatry

## 2024-04-04 DIAGNOSIS — Z01411 Encounter for gynecological examination (general) (routine) with abnormal findings: Secondary | ICD-10-CM | POA: Diagnosis not present

## 2024-04-04 DIAGNOSIS — Z1331 Encounter for screening for depression: Secondary | ICD-10-CM | POA: Diagnosis not present

## 2024-04-04 DIAGNOSIS — Z124 Encounter for screening for malignant neoplasm of cervix: Secondary | ICD-10-CM | POA: Diagnosis not present

## 2024-04-04 DIAGNOSIS — Z01419 Encounter for gynecological examination (general) (routine) without abnormal findings: Secondary | ICD-10-CM | POA: Diagnosis not present

## 2024-04-04 DIAGNOSIS — Z1231 Encounter for screening mammogram for malignant neoplasm of breast: Secondary | ICD-10-CM | POA: Diagnosis not present

## 2024-04-04 LAB — HM MAMMOGRAPHY

## 2024-04-10 LAB — HM PAP SMEAR: HPV, high-risk: NEGATIVE

## 2024-04-21 DIAGNOSIS — M722 Plantar fascial fibromatosis: Secondary | ICD-10-CM | POA: Diagnosis not present

## 2024-05-19 DIAGNOSIS — M722 Plantar fascial fibromatosis: Secondary | ICD-10-CM | POA: Diagnosis not present

## 2024-06-15 DIAGNOSIS — I10 Essential (primary) hypertension: Secondary | ICD-10-CM | POA: Diagnosis not present

## 2024-06-17 DIAGNOSIS — I10 Essential (primary) hypertension: Secondary | ICD-10-CM | POA: Diagnosis not present

## 2024-07-02 ENCOUNTER — Encounter: Payer: Self-pay | Admitting: Physician Assistant

## 2024-07-02 ENCOUNTER — Ambulatory Visit: Admitting: Physician Assistant

## 2024-07-02 VITALS — BP 136/78 | HR 74 | Temp 98.1°F | Ht 65.0 in | Wt 164.0 lb

## 2024-07-02 DIAGNOSIS — I10 Essential (primary) hypertension: Secondary | ICD-10-CM

## 2024-07-02 DIAGNOSIS — E782 Mixed hyperlipidemia: Secondary | ICD-10-CM | POA: Diagnosis not present

## 2024-07-02 DIAGNOSIS — F5101 Primary insomnia: Secondary | ICD-10-CM

## 2024-07-02 DIAGNOSIS — E88819 Insulin resistance, unspecified: Secondary | ICD-10-CM | POA: Diagnosis not present

## 2024-07-02 LAB — CBC WITH DIFFERENTIAL/PLATELET
Basophils Absolute: 0 K/uL (ref 0.0–0.1)
Basophils Relative: 0.4 % (ref 0.0–3.0)
Eosinophils Absolute: 0.1 K/uL (ref 0.0–0.7)
Eosinophils Relative: 1.1 % (ref 0.0–5.0)
HCT: 37.1 % (ref 36.0–46.0)
Hemoglobin: 12.2 g/dL (ref 12.0–15.0)
Lymphocytes Relative: 31.9 % (ref 12.0–46.0)
Lymphs Abs: 1.5 K/uL (ref 0.7–4.0)
MCHC: 32.9 g/dL (ref 30.0–36.0)
MCV: 90.2 fl (ref 78.0–100.0)
Monocytes Absolute: 0.3 K/uL (ref 0.1–1.0)
Monocytes Relative: 6.8 % (ref 3.0–12.0)
Neutro Abs: 2.8 K/uL (ref 1.4–7.7)
Neutrophils Relative %: 59.8 % (ref 43.0–77.0)
Platelets: 303 K/uL (ref 150.0–400.0)
RBC: 4.12 Mil/uL (ref 3.87–5.11)
RDW: 14 % (ref 11.5–15.5)
WBC: 4.7 K/uL (ref 4.0–10.5)

## 2024-07-02 LAB — COMPREHENSIVE METABOLIC PANEL WITH GFR
ALT: 13 U/L (ref 0–35)
AST: 15 U/L (ref 0–37)
Albumin: 4.3 g/dL (ref 3.5–5.2)
Alkaline Phosphatase: 72 U/L (ref 39–117)
BUN: 17 mg/dL (ref 6–23)
CO2: 28 meq/L (ref 19–32)
Calcium: 9.3 mg/dL (ref 8.4–10.5)
Chloride: 103 meq/L (ref 96–112)
Creatinine, Ser: 0.71 mg/dL (ref 0.40–1.20)
GFR: 91.19 mL/min (ref >60.00)
Glucose, Bld: 104 mg/dL — ABNORMAL HIGH (ref 70–99)
Potassium: 3.9 meq/L (ref 3.5–5.1)
Sodium: 141 meq/L (ref 135–145)
Total Bilirubin: 0.5 mg/dL (ref 0.2–1.2)
Total Protein: 7.9 g/dL (ref 6.0–8.3)

## 2024-07-02 LAB — LIPID PANEL
Cholesterol: 224 mg/dL — ABNORMAL HIGH (ref 0–200)
HDL: 56.4 mg/dL (ref >39.00)
LDL Cholesterol: 133 mg/dL — ABNORMAL HIGH (ref 0–99)
NonHDL: 167.36
Total CHOL/HDL Ratio: 4
Triglycerides: 172 mg/dL — ABNORMAL HIGH (ref 0.0–149.0)
VLDL: 34.4 mg/dL (ref 0.0–40.0)

## 2024-07-02 LAB — TSH: TSH: 1.5 u[IU]/mL (ref 0.35–5.50)

## 2024-07-02 LAB — HEMOGLOBIN A1C: Hgb A1c MFr Bld: 6.3 % (ref 4.6–6.5)

## 2024-07-02 MED ORDER — AMLODIPINE BESYLATE-VALSARTAN 5-160 MG PO TABS: 1.0000 | ORAL_TABLET | Freq: Every day | ORAL | 1 refills | Status: DC

## 2024-07-02 NOTE — Patient Instructions (Addendum)
 It was great to see you!  Continue current blood pressure medication  Let me know if you have any worsening sleep issues  Let's follow-up in 3 months, sooner if you have concerns.  Sleep Hygiene  Do: (1) Go to bed at the same time each day. (2) Get up from bed at the same time each day. (3) Get regular exercise each day, preferably in the morning.  There is goof evidence that regular exercise improves restful sleep.  This includes stretching and aerobic exercise. (4) Get regular exposure to outdoor or bright lights, especially in the late afternoon. (5) Keep the temperature in your bedroom comfortable. (6) Keep the bedroom quiet when sleeping. (7) Keep the bedroom dark enough to facilitate sleep. (8) Use your bed only for sleep and sex. (9) Take medications as directed.  It is helpful to take prescribed sleeping pills 1 hour before bedtime, so they are causing drowsiness when you lie down, or 10 hours before getting up, to avoid daytime drowsiness. (10) Use a relaxation exercise just before going to sleep -- imagery, massage, warm bath. (11) Keep your feet and hands warm.  Wear warm socks and/or mittens or gloves to bed.  Don't: (1) Exercise just before going to bed. (2) Engage in stimulating activity just before bed, such as playing a competitive game, watching an exciting program on television, or having an important discussion with a loved one. (3) Have caffeine in the evening (coffee, teas, chocolate, sodas, etc.) (4) Read or watch television in bed. (5) Use alcohol to help you sleep. (6) Go to bed too hungry or too full. (7) Take another person's sleeping pills. (8) Take over-the-counter sleeping pills, without your doctor's knowledge.  Tolerance can develop rapidly with these medications.  Diphenhydramine can have serious side effects for elderly patients. (9) Take daytime naps. (10) Command yourself to go to sleep.  This only makes your mind and body more alert.  If you lie  awake for more than 20-30 minutes, get up, go to a different room, participate in a quiet activity (Ex - non-excitable reading or television), and then return to bed when you feel sleepy.  Do this as many times during the night as needed.  This may cause you to have a night or two of poor sleep but it will train your brain to know when it is time for sleep.     Take care,  Lucie Buttner PA-C

## 2024-07-02 NOTE — Progress Notes (Signed)
 Madison Turner is a 63 y.o. female here for a new problem.  History of Present Illness:   Chief Complaint  Patient presents with   Establish Care   Hypertension    Pt was seen at UC x 2 for elevated blood pressure. Pt was started on Amlodipine -Valsartan  5-160 mg daily.    Discussed the use of AI scribe software for clinical note transcription with the patient, who gave verbal consent to proceed.  History of Present Illness Madison Turner is a 63 year old female with hypertension who presents for management of her blood pressure.   Two weeks ago, she was diagnosed with hypertension and initially prescribed losartan 25 mg, which was ineffective. Her medication was changed to include amlodipine  5 - valsartan  160 mg, taken in the morning. Initial blood pressure readings were as high as 170/100 mmHg. She experiences leg swelling, attributed to prolonged standing at work, with no significant medication side effects.  She experiences intermittent sleep disturbances, waking up in the middle of the night and unable to return to sleep, exacerbated by stress. She occasionally uses melatonin, which is helpful.  Her past medical history includes successful trigger finger release surgery on her thumb. She denies high blood pressure or elevated blood sugar during pregnancies, significant lung issues, asthma, or exposure to secondhand smoke. She does not consume alcohol or smoke. 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    Past Medical History:  Diagnosis Date   Hypertension    Vaginal delivery    3 children     Social History   Tobacco Use   Smoking status: Never   Smokeless tobacco: Never  Vaping Use   Vaping status: Never Used  Substance Use Topics   Alcohol use: No   Drug use: No    Past Surgical History:  Procedure Laterality Date   TRIGGER FINGER RELEASE Right 01/13/2015   Procedure: RELEASE TRIGGER FINGER/A-1 PULLEY;  Surgeon: Norleen Gavel, MD;  Location:  [facility];  Service: Orthopedics;  Laterality: Right;    History reviewed. No pertinent family history.  No Known Allergies  Current Medications:   Current Outpatient Medications:    ascorbic acid (VITAMIN C) 500 MG tablet, Take 500 mg by mouth daily., Disp: , Rfl:    Vitamin D, Cholecalciferol, 25 MCG (1000 UT) CAPS, Take 1 capsule by mouth daily in the afternoon., Disp: , Rfl:    Zinc Sulfate (ZINC 15 PO), Take 1 tablet by mouth daily in the afternoon., Disp: , Rfl:    amLODipine -valsartan  (EXFORGE ) 5-160 MG tablet, Take 1 tablet by mouth daily., Disp: 90 tablet, Rfl: 1   Review of Systems:   Negative unless otherwise specified per HPI.  Vitals:   Vitals:   07/02/24 0954  BP: 136/78  Pulse: 74  Temp: 98.1 F (36.7 C)  TempSrc: Temporal  SpO2: 95%  Weight: 164 lb (74.4 kg)  Height: 5' 5 (1.651 m)     Body mass index is 27.29 kg/m.  Physical Exam:   Physical Exam Vitals and nursing note reviewed.  Constitutional:      General: She is not in acute distress.    Appearance: She is well-developed. She is not ill-appearing or toxic-appearing.  Cardiovascular:     Rate and Rhythm: Normal rate and regular rhythm.     Pulses: Normal pulses.     Heart sounds: Normal heart sounds, S1 normal and S2 normal.  Pulmonary:     Effort: Pulmonary effort is  normal.     Breath sounds: Normal breath sounds.  Skin:    General: Skin is warm and dry.  Neurological:     Mental Status: She is alert.     GCS: GCS eye subscore is 4. GCS verbal subscore is 5. GCS motor subscore is 6.  Psychiatric:        Speech: Speech normal.        Behavior: Behavior normal. Behavior is cooperative.     Assessment and Plan:   Assessment and Plan Assessment & Plan Hypertension Losartan was ineffective; switched to amlodipine  5 - valsartan  160 mg combination, which effectively lowered blood pressure without significant side effects. - Continue current antihypertensive  medication. - Perform blood work today. - Schedule follow-up appointment in three months for annual Comprehensive Physical Exam (CPE) preventive care annual visit.   Insomnia Intermittent insomnia related to stress; melatonin effective when used. - Monitor sleep patterns and stress levels. - Use melatonin as needed for sleep disturbances.  Prediabetes Blood sugar previously in prediabetic range. - Perform blood work today to reassess glucose levels.  Hyperlipidemia Cholesterol levels previously slightly elevated. - Perform blood work today to reassess lipid levels.  Follow-Up Discussed follow-up care to monitor health conditions and medication effectiveness. - Schedule follow-up appointment in three months.     Madison Buttner, PA-C

## 2024-07-03 ENCOUNTER — Ambulatory Visit: Payer: Self-pay | Admitting: Physician Assistant

## 2024-08-01 DIAGNOSIS — B084 Enteroviral vesicular stomatitis with exanthem: Secondary | ICD-10-CM | POA: Diagnosis not present

## 2024-08-04 ENCOUNTER — Ambulatory Visit: Payer: Self-pay

## 2024-08-04 ENCOUNTER — Ambulatory Visit: Admitting: Physician Assistant

## 2024-08-04 ENCOUNTER — Encounter: Payer: Self-pay | Admitting: Physician Assistant

## 2024-08-04 VITALS — BP 150/80 | HR 69 | Temp 98.2°F | Ht 65.0 in | Wt 161.5 lb

## 2024-08-04 DIAGNOSIS — R21 Rash and other nonspecific skin eruption: Secondary | ICD-10-CM | POA: Diagnosis not present

## 2024-08-04 DIAGNOSIS — I10 Essential (primary) hypertension: Secondary | ICD-10-CM | POA: Diagnosis not present

## 2024-08-04 MED ORDER — PREDNISONE 10 MG PO TABS: ORAL_TABLET | ORAL | 0 refills | Status: AC

## 2024-08-04 MED ORDER — CLOBETASOL PROPIONATE 0.05 % EX CREA: 1.0000 | TOPICAL_CREAM | Freq: Two times a day (BID) | CUTANEOUS | 0 refills | Status: AC

## 2024-08-04 NOTE — Patient Instructions (Signed)
 It was great to see you!  Start oral prednisone to help with your rash - take daily x 2 weeks May use stronger cream that I have sent in  Consider OTC (available over the counter without a prescription) benadryl to help with nighttime itching  If no improvement or doesn't go away completely, come see me!  Take care,  Lucie Buttner PA-C

## 2024-08-04 NOTE — Progress Notes (Signed)
 Madison Turner is a 63 y.o. female here for a follow up of a pre-existing problem.  History of Present Illness:   Chief Complaint  Patient presents with   Rash    Pt c/o rash on hands and feet started several days ago, was seen at [facility] on Aug 26, 2024 Dx with Hand, Foot & Mouth. Pt was prescribed Triamcinolone cream, is helping some, but still itching.    Madison Turner is a 63 yo female who presents with a pruritic rash on her hands and feet.  The rash began on her hands and feet last 24-Aug-2024 and became more pronounced by August 26, 2024. It is pruritic and located on the dorsum of her hands and feet, sparing the soles. There are no associated sore throat, mouth pain, fever, chills, or myalgias. She has no eczema or other dermatological conditions.  She applies a topical cream more than twice daily, which provides relief from the pruritus. She cleanses the affected areas with soap and water prior to application. The rash remains closed and initially presented with a different color.  She was diagnosis with hand, foot and mouth from the urgent care.  Currently taking amlodipine -valsartan  5-160 mg. At home blood pressure readings are: not checked. Patient denies chest pain, SOB, blurred vision, dizziness, unusual headaches, lower leg swelling. Patient is usually compliant with medication but did not check this today. Denies excessive caffeine intake, stimulant usage, excessive alcohol intake, or increase in salt consumption.  BP Readings from Last 3 Encounters:  08/04/24 (!) 150/80  07/02/24 136/78  07/12/19 140/78     Past Medical History:  Diagnosis Date   Hypertension    Vaginal delivery    3 children     Social History   Tobacco Use   Smoking status: Never   Smokeless tobacco: Never  Vaping Use   Vaping status: Never Used  Substance Use Topics   Alcohol use: No   Drug use: No    Past Surgical History:  Procedure Laterality Date   TRIGGER FINGER RELEASE Right 01/13/2015   Procedure:  RELEASE TRIGGER FINGER/A-1 PULLEY;  Surgeon: Norleen Gavel, MD;  Location: [facility];  Service: Orthopedics;  Laterality: Right;    No family history on file.  No Known Allergies  Current Medications:   Current Outpatient Medications:    amLODipine -valsartan  (EXFORGE ) 5-160 MG tablet, Take 1 tablet by mouth daily., Disp: 90 tablet, Rfl: 1   ascorbic acid (VITAMIN C) 500 MG tablet, Take 500 mg by mouth daily., Disp: , Rfl:    clobetasol cream (TEMOVATE) 0.05 %, Apply 1 Application topically 2 (two) times daily., Disp: 30 g, Rfl: 0   predniSONE (DELTASONE) 10 MG tablet, Take 2 tablets daily x 1 week then 1 tablet daily x 1 week., Disp: 21 tablet, Rfl: 0   triamcinolone ointment (KENALOG) 0.5 %, Apply 1 Application topically 2 (two) times daily as needed., Disp: , Rfl:    Vitamin D, Cholecalciferol, 25 MCG (1000 UT) CAPS, Take 1 capsule by mouth daily in the afternoon., Disp: , Rfl:    Zinc Sulfate (ZINC 15 PO), Take 1 tablet by mouth daily in the afternoon., Disp: , Rfl:    Review of Systems:   Negative unless otherwise specified per HPI.  Vitals:   Vitals:   08/04/24 0846 08/04/24 0905  BP: (!) 160/90 (!) 150/80  Pulse: 69   Temp: 98.2 F (36.8 C)   TempSrc: Temporal   SpO2: 98%   Weight: 161 lb 8 oz (73.3 kg)  Height: 5' 5 (1.651 m)      Body mass index is 26.88 kg/m.  Physical Exam:   Physical Exam Vitals and nursing note reviewed.  Constitutional:      General: She is not in acute distress.    Appearance: She is well-developed. She is not ill-appearing or toxic-appearing.  Cardiovascular:     Rate and Rhythm: Normal rate and regular rhythm.     Pulses: Normal pulses.     Heart sounds: Normal heart sounds, S1 normal and S2 normal.  Pulmonary:     Effort: Pulmonary effort is normal.     Breath sounds: Normal breath sounds.  Skin:    General: Skin is warm and dry.     Comments: Bilateral hands with multiple erythematous papules on palms and  dorsum  Bilateral feet with erythematous papules surrounding heels  No lesions to mouth or soles of feet   Neurological:     Mental Status: She is alert.     GCS: GCS eye subscore is 4. GCS verbal subscore is 5. GCS motor subscore is 6.  Psychiatric:        Speech: Speech normal.        Behavior: Behavior normal. Behavior is cooperative.     Assessment and Plan:   Rash and nonspecific skin eruption Pruritic rash. Current topical steroid provides partial relief. - Prescribe clobetasol cream - Prescribe low-dose oral steroid for two weeks. - Advise against applying cream on face. - Instruct to wear gloves if rash opens to prevent spread - Advise moisturizing hands after washing.  Hypertension Elevated blood pressure noted. she missed antihypertensive medication and breakfast. Above goal today No evidence of end-organ damage on my exam Recommend patient monitor home blood pressure at least a few times weekly Continue amlodipine -valsartan  5-160 mg daily If home monitoring shows consistent elevation, or any symptom(s) develop, recommend reach out to us  for further advice on next steps     Lucie Buttner, PA-C

## 2024-08-04 NOTE — Telephone Encounter (Signed)
 FYI Only or Action Required?: FYI only for provider.  Patient was last seen in primary care on 07/02/2024 by Job Lukes, PA.  Called Nurse Triage reporting Rash.  Symptoms began several days ago.  Interventions attempted: Nothing.  Symptoms are: unchanged.  Triage Disposition: See Physician Within 24 Hours  Patient/caregiver understands and will follow disposition?: Yes    Copied from CRM [id number]. Topic: Clinical - Red Word Triage >> Aug 04, 2024  8:03 AM Mesmerise C wrote: [facility] that prompted transfer to Nurse Triage: Patient's daughter [name] stated on Friday patient had an allergic reaction on hands and feet took her to UC and they said it was a virus was prescribed a cream and it hasn't really helped, described it to be bumps with itchiness the cream helps with the itchiness Reason for Disposition  SEVERE itching (i.e., interferes with sleep, normal activities or school)  Answer Assessment - Initial Assessment Questions 1. APPEARANCE of RASH: What does the rash look like? (e.g., blisters, dry flaky skin, red spots, redness, sores)     Red, tiny bumps 2. SIZE: How big are the spots? (e.g., tip of pen, eraser, coin; inches, centimeters)     States hand foot and mouth disease diagnosed on Friday 3. LOCATION: Where is the rash located?     On hand and feet 4. COLOR: What color is the rash? (Note: It is difficult to assess rash color in people with darker-colored skin. When this situation occurs, simply ask the caller to describe what they see.)     red 5. ONSET: When did the rash begin?     Friday 6. FEVER: Do you have a fever? If Yes, ask: What is your temperature, how was it measured, and when did it start?     denies 7. ITCHING: Does the rash itch? If Yes, ask: How bad is the itch? (Scale 1-10; or mild, moderate, severe)     On hands it itches 8. CAUSE: What do you think is causing the rash?     See above 9. MEDICINE FACTORS: Have you started  any new medicines within the last 2 weeks? (e.g., antibiotics)      Given cream for rash and is not helping 10. OTHER SYMPTOMS: Do you have any other symptoms? (e.g., dizziness, headache, sore throat, joint pain)       denies 11. PREGNANCY: Is there any chance you are pregnant? When was your last menstrual period?       na  Protocols used: Rash or Redness - [facility]

## 2024-10-09 ENCOUNTER — Ambulatory Visit: Admitting: Physician Assistant

## 2024-10-09 ENCOUNTER — Encounter: Payer: Self-pay | Admitting: Physician Assistant

## 2024-10-09 VITALS — BP 168/82 | HR 74 | Temp 98.2°F | Ht 65.0 in | Wt 163.6 lb

## 2024-10-09 DIAGNOSIS — Z1211 Encounter for screening for malignant neoplasm of colon: Secondary | ICD-10-CM

## 2024-10-09 DIAGNOSIS — I10 Essential (primary) hypertension: Secondary | ICD-10-CM

## 2024-10-09 DIAGNOSIS — Z23 Encounter for immunization: Secondary | ICD-10-CM

## 2024-10-09 DIAGNOSIS — M79673 Pain in unspecified foot: Secondary | ICD-10-CM

## 2024-10-09 DIAGNOSIS — E88819 Insulin resistance, unspecified: Secondary | ICD-10-CM

## 2024-10-09 LAB — CBC WITH DIFFERENTIAL/PLATELET
Basophils Absolute: 0 K/uL (ref 0.0–0.1)
Basophils Relative: 0.6 % (ref 0.0–3.0)
Eosinophils Absolute: 0.1 K/uL (ref 0.0–0.7)
Eosinophils Relative: 1.3 % (ref 0.0–5.0)
HCT: 36.4 % (ref 36.0–46.0)
Hemoglobin: 12.3 g/dL (ref 12.0–15.0)
Lymphocytes Relative: 31.8 % (ref 12.0–46.0)
Lymphs Abs: 1.6 K/uL (ref 0.7–4.0)
MCHC: 33.7 g/dL (ref 30.0–36.0)
MCV: 89.4 fl (ref 78.0–100.0)
Monocytes Absolute: 0.3 K/uL (ref 0.1–1.0)
Monocytes Relative: 5.4 % (ref 3.0–12.0)
Neutro Abs: 3 K/uL (ref 1.4–7.7)
Neutrophils Relative %: 60.9 % (ref 43.0–77.0)
Platelets: 314 K/uL (ref 150.0–400.0)
RBC: 4.07 Mil/uL (ref 3.87–5.11)
RDW: 13.9 % (ref 11.5–15.5)
WBC: 4.9 K/uL (ref 4.0–10.5)

## 2024-10-09 LAB — COMPREHENSIVE METABOLIC PANEL WITH GFR
ALT: 12 U/L (ref 0–35)
AST: 13 U/L (ref 0–37)
Albumin: 4.4 g/dL (ref 3.5–5.2)
Alkaline Phosphatase: 74 U/L (ref 39–117)
BUN: 15 mg/dL (ref 6–23)
CO2: 29 meq/L (ref 19–32)
Calcium: 9.8 mg/dL (ref 8.4–10.5)
Chloride: 103 meq/L (ref 96–112)
Creatinine, Ser: 0.7 mg/dL (ref 0.40–1.20)
GFR: 92.58 mL/min (ref >60.00)
Glucose, Bld: 104 mg/dL — ABNORMAL HIGH (ref 70–99)
Potassium: 4 meq/L (ref 3.5–5.1)
Sodium: 138 meq/L (ref 135–145)
Total Bilirubin: 0.5 mg/dL (ref 0.2–1.2)
Total Protein: 7.8 g/dL (ref 6.0–8.3)

## 2024-10-09 LAB — LIPID PANEL
Cholesterol: 218 mg/dL — ABNORMAL HIGH (ref 0–200)
HDL: 52.6 mg/dL (ref >39.00)
LDL Cholesterol: 137 mg/dL — ABNORMAL HIGH (ref 0–99)
NonHDL: 165.39
Total CHOL/HDL Ratio: 4
Triglycerides: 144 mg/dL (ref 0.0–149.0)
VLDL: 28.8 mg/dL (ref 0.0–40.0)

## 2024-10-09 MED ORDER — MELOXICAM 15 MG PO TABS: 15.0000 mg | ORAL_TABLET | Freq: Every day | ORAL | 1 refills | Status: AC

## 2024-10-09 MED ORDER — AMLODIPINE BESYLATE-VALSARTAN 5-160 MG PO TABS: 1.0000 | ORAL_TABLET | Freq: Every day | ORAL | 1 refills | Status: AC

## 2024-10-09 NOTE — Progress Notes (Signed)
 History of Present Illness:   Chief Complaint  Patient presents with   Medical Management of Chronic Issues   Hypertension    Here for a 3 month follow-up for hypertension and possible A1C recheck. Has been checking BP at home. This am it was 166/91. Took meds.    Discussed the use of AI scribe software for clinical note transcription with the patient, who gave verbal consent to proceed.  History of Present Illness   Madison Turner is a 63 year old female with hypertension who presents for blood pressure management and medication refill.  Her blood pressure has been high and variable, with a reading of 166/91 mmHg this morning. She takes her antihypertensive medication only 2 to 3 times per week based on stress, and she notes readings near 123/60 mmHg when she does take it and lower readings when she feels relaxed.  She takes meloxicam and is almost out and requests a refill.  She often wakes during the night, sometimes due to her children, and feels very tired the next day. She has used melatonin and asks if it is safe to take nightly.  She recalls a colonoscopy 2 to 3 years ago in [place]  but is unsure of the exact date or facility.  She reports August blood work showed elevated cholesterol and blood sugars close to the prediabetic range.        Past Medical History:  Diagnosis Date   Hypertension    Vaginal delivery    3 children     Social History   Tobacco Use   Smoking status: Never   Smokeless tobacco: Never  Vaping Use   Vaping status: Never Used  Substance Use Topics   Alcohol use: No   Drug use: No    Past Surgical History:  Procedure Laterality Date   TRIGGER FINGER RELEASE Right 01/13/2015   Procedure: RELEASE TRIGGER FINGER/A-1 PULLEY;  Surgeon: Norleen Gavel, MD;  Location: [facility];  Service: Orthopedics;  Laterality: Right;    History reviewed. No pertinent family history.  No Known Allergies  Current Medications:    Current Outpatient Medications:    ascorbic acid (VITAMIN C) 500 MG tablet, Take 500 mg by mouth daily., Disp: , Rfl:    Vitamin D, Cholecalciferol, 25 MCG (1000 UT) CAPS, Take 1 capsule by mouth daily in the afternoon., Disp: , Rfl:    Zinc Sulfate (ZINC 15 PO), Take 1 tablet by mouth daily in the afternoon., Disp: , Rfl:    amLODipine -valsartan  (EXFORGE ) 5-160 MG tablet, Take 1 tablet by mouth daily., Disp: 90 tablet, Rfl: 1   clobetasol  cream (TEMOVATE ) 0.05 %, Apply 1 Application topically 2 (two) times daily. (Patient not taking: Reported on 10/09/2024), Disp: 30 g, Rfl: 0   meloxicam (MOBIC) 15 MG tablet, Take 1 tablet (15 mg total) by mouth daily., Disp: 90 tablet, Rfl: 1   predniSONE  (DELTASONE ) 10 MG tablet, Take 2 tablets daily x 1 week then 1 tablet daily x 1 week. (Patient not taking: Reported on 10/09/2024), Disp: 21 tablet, Rfl: 0   triamcinolone ointment (KENALOG) 0.5 %, Apply 1 Application topically 2 (two) times daily as needed. (Patient not taking: Reported on 10/09/2024), Disp: , Rfl:    Review of Systems:   Negative unless otherwise specified per HPI.  Vitals:   Vitals:   10/09/24 0804 10/09/24 0831  BP: (!) 158/84 (!) 168/82  Pulse: 74   Temp: 98.2 F (36.8 C)   TempSrc: Temporal   SpO2:  97%   Weight: 163 lb 9.6 oz (74.2 kg)   Height: 5' 5 (1.651 m)      Body mass index is 27.22 kg/m.  Physical Exam:   Physical Exam Vitals and nursing note reviewed.  Constitutional:      General: She is not in acute distress.    Appearance: She is well-developed. She is not ill-appearing or toxic-appearing.  Cardiovascular:     Rate and Rhythm: Normal rate and regular rhythm.     Pulses: Normal pulses.     Heart sounds: Normal heart sounds, S1 normal and S2 normal.  Pulmonary:     Effort: Pulmonary effort is normal.     Breath sounds: Normal breath sounds.  Skin:    General: Skin is warm and dry.  Neurological:     Mental Status: She is alert.     GCS: GCS eye  subscore is 4. GCS verbal subscore is 5. GCS motor subscore is 6.  Psychiatric:        Speech: Speech normal.        Behavior: Behavior normal. Behavior is cooperative.     Assessment and Plan:   Assessment and Plan    Essential hypertension Blood pressure inconsistent due to irregular medication adherence. - Advised daily antihypertensive medication amlodipine -valsartan  5-160 for consistent control. - Instructed regular blood pressure monitoring and to report if systolic <90 mmHg or diastolic <60 mmHg, or if lightheadedness or dizziness occurs.  Encounter for immunization Received tetanus shot today.  Encounter for screening for malignant neoplasm of colon Colonoscopy performed 2-3 years ago; records needed to confirm details. - Requested previous colonoscopy records from [facility]. - Will proceed with screening if due.  Insulin resistance Previous blood sugar levels indicated prediabetes. - Ordered blood work to recheck blood sugar levels.  Sleep disturbance Difficulty sleeping, prefers non-medication approach. - Recommended melatonin 30-60 minutes before bed, up to 10 mg.         Lucie Buttner, PA-C

## 2024-10-09 NOTE — Patient Instructions (Signed)
  VISIT SUMMARY: Today, we discussed your blood pressure management, medication refills, sleep issues, and preventive health screenings. You also received a tetanus shot.  YOUR PLAN: ESSENTIAL HYPERTENSION: Your blood pressure has been inconsistent due to irregular medication use. -Take your blood pressure medication daily for consistent control. -Monitor your blood pressure regularly and report if your systolic reading is below 90 mmHg or your diastolic reading is below 60 mmHg, or if you feel lightheaded or dizzy.  ENCOUNTER FOR IMMUNIZATION: You received a tetanus shot today. -No further action needed at this time.  ENCOUNTER FOR SCREENING FOR MALIGNANT NEOPLASM OF COLON: You had a colonoscopy 2-3 years ago, but we need the records to confirm the details. -Request your previous colonoscopy records. -We will proceed with screening if it is due.  INSULIN RESISTANCE: Your previous blood work showed blood sugar levels close to the prediabetic range. -We have ordered blood work to recheck your blood sugar levels.  SLEEP DISTURBANCE: You have difficulty sleeping and prefer a non-medication approach. -You can take melatonin 30-60 minutes before bed, up to 10 mg.                      Contains text generated by [employer].                                 Contains text generated by [employer].

## 2024-10-10 ENCOUNTER — Telehealth: Payer: Self-pay | Admitting: Physician Assistant

## 2024-10-10 ENCOUNTER — Ambulatory Visit: Payer: Self-pay | Admitting: Physician Assistant

## 2024-10-10 ENCOUNTER — Other Ambulatory Visit: Payer: Self-pay

## 2024-10-10 LAB — HEMOGLOBIN A1C
Hgb A1c MFr Bld: 5.9 % — ABNORMAL HIGH (ref <5.7)
Mean Plasma Glucose: 123 mg/dL
eAG (mmol/L): 6.8 mmol/L

## 2024-10-10 MED ORDER — ATORVASTATIN CALCIUM 20 MG PO TABS: 20.0000 mg | ORAL_TABLET | Freq: Every day | ORAL | 0 refills | Status: AC

## 2024-10-13 ENCOUNTER — Encounter: Payer: Self-pay | Admitting: Physician Assistant

## 2024-10-13 NOTE — Telephone Encounter (Signed)
 I've contacted the patient's daughter Friday 12/5 and Monday 12/8 regarding her release of information form that was filled out, the daughter tried to call and said they would reach us  today with the following information it is missing where it needs to be released and disclosed.

## 2024-10-13 NOTE — Telephone Encounter (Addendum)
 Madison Turner callled the patients emergency contact on 12/5 regarding ROI.

## 2024-10-16 ENCOUNTER — Encounter: Payer: Self-pay | Admitting: Physician Assistant
